# Patient Record
Sex: Male | Born: 1980 | Race: Black or African American | Hispanic: No | Marital: Married | Smoking: Never smoker
Health system: Southern US, Community
[De-identification: ages and names within clinical notes are randomized; demographics above are authoritative.]

---
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
## 2006-12-04 ENCOUNTER — Emergency Department (HOSPITAL_COMMUNITY): Admission: EM | Admit: 2006-12-04 | Discharge: 2006-12-04 | Payer: Self-pay | Admitting: Emergency Medicine

## 2009-09-20 ENCOUNTER — Emergency Department (HOSPITAL_COMMUNITY): Admission: EM | Admit: 2009-09-20 | Discharge: 2009-09-20 | Payer: Self-pay | Admitting: Emergency Medicine

## 2015-12-15 ENCOUNTER — Encounter (HOSPITAL_COMMUNITY): Payer: Self-pay | Admitting: *Deleted

## 2015-12-15 ENCOUNTER — Emergency Department (HOSPITAL_COMMUNITY)
Admission: EM | Admit: 2015-12-15 | Discharge: 2015-12-15 | Disposition: A | Discharge status: Home / Self Care | Payer: Self-pay | Attending: Emergency Medicine | Admitting: Emergency Medicine

## 2015-12-15 DIAGNOSIS — K0889 Other specified disorders of teeth and supporting structures: Secondary | ICD-10-CM | POA: Insufficient documentation

## 2015-12-15 MED ORDER — PENICILLIN V POTASSIUM 500 MG PO TABS: 500.0000 mg | ORAL_TABLET | Freq: Three times a day (TID) | ORAL | 0 refills | Status: AC

## 2015-12-15 MED ORDER — TRAMADOL HCL 50 MG PO TABS: 50.0000 mg | ORAL_TABLET | Freq: Four times a day (QID) | ORAL | 0 refills | Status: AC | PRN

## 2015-12-15 MED ORDER — PENICILLIN V POTASSIUM 250 MG PO TABS
500.0000 mg | ORAL_TABLET | Freq: Once | ORAL | Status: AC
  Administered 2015-12-15: 500 mg via ORAL
  Filled 2015-12-15: qty 2

## 2015-12-15 MED ORDER — TRAMADOL HCL 50 MG PO TABS
50.0000 mg | ORAL_TABLET | Freq: Once | ORAL | Status: AC
  Administered 2015-12-15: 50 mg via ORAL
  Filled 2015-12-15: qty 1

## 2015-12-15 NOTE — ED Provider Notes (Signed)
  [facility]-EMERGENCY DEPT Provider Note   CSN: [id number] Arrival date & time: 12/15/15  0201  First Provider Contact:  None       History   Chief Complaint Chief Complaint  Patient presents with  . Dental Pain    HPI Kevin Costa is a 35 y.o. male.  HPI  History reviewed. No pertinent past medical history.  There are no active problems to display for this patient.   History reviewed. No pertinent surgical history.     Home Medications    Prior to Admission medications   Medication Sig Start Date End Date Taking? Authorizing Provider  penicillin v potassium (VEETID) 500 MG tablet Take 1 tablet (500 mg total) by mouth 3 (three) times daily. 12/15/15   Earley FavorGail Ayshia Gramlich, NP  traMADol (ULTRAM) 50 MG tablet Take 1 tablet (50 mg total) by mouth every 6 (six) hours as needed. 12/15/15   Earley FavorGail Rielle Schlauch, NP    Family History No family history on file.  Social History Social History  Substance Use Topics  . Smoking status: Never Smoker  . Smokeless tobacco: Never Used  . Alcohol use Yes     Allergies   Review of patient's allergies indicates no known allergies.   Review of Systems Review of Systems  HENT: Positive for dental problem.   Neurological: Negative for headaches.  All other systems reviewed and are negative.    Physical Exam Updated Vital Signs BP 136/90 (BP Location: Right Arm)   Pulse (!) 50   Temp 98 F (36.7 C) (Oral)   Resp 16   Ht 6\' 7"  (2.007 m)   Wt 81.6 kg   SpO2 98%   BMI 20.28 kg/m   Physical Exam  Constitutional: He appears well-developed.  HENT:  Head: Normocephalic.  Mouth/Throat:    Neck: Normal range of motion.  Cardiovascular: Normal rate.   Pulmonary/Chest: Effort normal.  Musculoskeletal: Normal range of motion.  Neurological: He is alert.  Skin: Skin is warm and dry.  Psychiatric: He has a normal mood and affect.  Nursing note and vitals reviewed.    ED Treatments / Results  Labs (all labs ordered are listed, but only  abnormal results are displayed) Labs Reviewed - No data to display  EKG  EKG Interpretation None       Radiology No results found.  Procedures Procedures (including critical care time)  Medications Ordered in ED Medications  traMADol (ULTRAM) tablet 50 mg (not administered)  penicillin v potassium (VEETID) tablet 500 mg (not administered)     Initial Impression / Assessment and Plan / ED Course  I have reviewed the triage vital signs and the nursing notes.  Pertinent labs & imaging results that were available during my care of the patient were reviewed by me and considered in my medical decision making (see chart for details).  Clinical Course       Final Clinical Impressions(s) / ED Diagnoses   Final diagnoses:  Pain, dental    New Prescriptions New Prescriptions   PENICILLIN V POTASSIUM (VEETID) 500 MG TABLET    Take 1 tablet (500 mg total) by mouth 3 (three) times daily.   TRAMADOL (ULTRAM) 50 MG TABLET    Take 1 tablet (50 mg total) by mouth every 6 (six) hours as needed.     Earley FavorGail Saqib Cazarez, NP 12/15/15 0301    Layla MawKristen N Ward, DO 12/15/15 [id number]

## 2015-12-15 NOTE — Discharge Instructions (Signed)
You have been give a resource guide to find a dentist  You have been give 2 prescriptions one for pain control the other an antibiotic to control/prevent infection

## 2015-12-15 NOTE — ED Triage Notes (Signed)
Patient states he has a broken back tooth to the top left and a wisdom tooth on the bottom left is causing pain

## 2017-06-24 ENCOUNTER — Emergency Department (HOSPITAL_COMMUNITY)
Admission: EM | Admit: 2017-06-24 | Discharge: 2017-06-24 | Disposition: A | Discharge status: Home / Self Care | Payer: 59 | Attending: Emergency Medicine | Admitting: Emergency Medicine

## 2017-06-24 ENCOUNTER — Encounter (HOSPITAL_COMMUNITY): Payer: Self-pay | Admitting: *Deleted

## 2017-06-24 DIAGNOSIS — K130 Diseases of lips: Secondary | ICD-10-CM | POA: Diagnosis not present

## 2017-06-24 DIAGNOSIS — T7840XA Allergy, unspecified, initial encounter: Secondary | ICD-10-CM | POA: Diagnosis present

## 2017-06-24 DIAGNOSIS — L245 Irritant contact dermatitis due to other chemical products: Secondary | ICD-10-CM | POA: Insufficient documentation

## 2017-06-24 MED ORDER — CEPHALEXIN 500 MG PO CAPS: 500.0000 mg | ORAL_CAPSULE | Freq: Four times a day (QID) | ORAL | 0 refills | Status: AC

## 2017-06-24 MED ORDER — TRIAMCINOLONE ACETONIDE 0.025 % EX OINT: 1.0000 "application " | TOPICAL_OINTMENT | Freq: Two times a day (BID) | CUTANEOUS | 0 refills | Status: AC

## 2017-06-24 NOTE — ED Provider Notes (Signed)
[facility] EMERGENCY DEPARTMENT Provider Note   CSN: [id number] Arrival date & time: 06/24/17  0751     History   Chief Complaint Chief Complaint  Patient presents with  . Allergic Reaction    HPI Kevin Costa is a 37 y.o. male resents to the emergency department with a chief complaint of redness, pain, swelling to the superior and inferior lips with surrounding redness to the perioral skin.   He reports that his lips were extremely dry and cracked 2 days ago so he applied coconut oil and coconut butter to the lips and surrounding area over the last two days. He reports the lips gradually became increasingly red, painful, and swollen, and the perioral skin became red.   He denies throat closing, tongue swelling, shortness of breath, oral lesions chest pain, rash, lightheadedness, dizziness, dental pain, sore throat fever, or chills.  He reports 2 similar episodes in the past, most recent was 2 months ago.  Both episodes began with dry chapped lips and worsen after he applied cocoa butter.  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  He denies new foods, lotions, face wash, or detergents.  He reports that he changed body wash some time ago.  He has no chronic medical conditions.  He takes no daily medications or supplements.   No history of asthma, allergies, or allergic reactions.  The history is provided by the patient. No language interpreter was used.    History reviewed. No pertinent past medical history.  There are no active problems to display for this patient.   History reviewed. No pertinent surgical history.     Home Medications    Prior to Admission medications   Medication Sig Start Date End Date Taking? Authorizing Provider  cephALEXin (KEFLEX) 500 MG capsule Take 1 capsule (500 mg total) by mouth 4 (four) times daily for 5 days. 06/24/17 06/29/17  Kaeden Mester A, PA-C  penicillin v potassium (VEETID) 500 MG tablet  Take 1 tablet (500 mg total) by mouth 3 (three) times daily. 12/15/15   Earley Favor, NP  traMADol (ULTRAM) 50 MG tablet Take 1 tablet (50 mg total) by mouth every 6 (six) hours as needed. 12/15/15   Earley Favor, NP  triamcinolone (KENALOG) 0.025 % ointment Apply 1 application topically 2 (two) times daily. 06/24/17   Pierra Skora A, PA-C    Family History No family history on file.  Social History Social History   Tobacco Use  . Smoking status: Never Smoker  . Smokeless tobacco: Never Used  Substance Use Topics  . Alcohol use: Yes  . Drug use: No     Allergies   Patient has no known allergies.   Review of Systems Review of Systems  Constitutional: Negative for appetite change, chills and fever.  HENT: Positive for facial swelling (lips). Negative for mouth sores, sore throat, trouble swallowing and voice change.   Respiratory: Negative for shortness of breath and stridor.   Cardiovascular: Negative for chest pain.  Gastrointestinal: Negative for abdominal pain, diarrhea, nausea and vomiting.  Genitourinary: Negative for dysuria.  Musculoskeletal: Negative for back pain.  Skin: Positive for color change. Negative for rash.  Allergic/Immunologic: Negative for immunocompromised state.  Neurological: Negative for dizziness, syncope, light-headedness and headaches.  Psychiatric/Behavioral: Negative for confusion.     Physical Exam Updated Vital Signs BP (!) 122/91 (BP Location: Left Arm)   Pulse (!) 52   Temp 98.4 F (36.9 C) (Oral)  Resp 16   SpO2 100%   Physical Exam  Constitutional: He appears well-developed and well-nourished. No distress.  Sitting upright comfortably.  Speaking in complete fluent sentences without increased work of breathing.  NAD.  HENT:  Head: Normocephalic.  Mouth/Throat: Uvula is midline, oropharynx is clear and moist and mucous membranes are normal. No oral lesions. No trismus in the jaw. No dental abscesses or uvula swelling. No  oropharyngeal exudate or posterior oropharyngeal edema. No tonsillar exudate.  Erythema noted to the circumferential perioral region that extends across the vermilion border to the superior and inferior labia.  Bilateral labia are mildly edematous and tender to palpation.  Cracking is present over the bilateral oral measures.  No warmth or focal erythema.  Oropharynx is patent.  Eyes: Conjunctivae are normal.  Neck: Normal range of motion. Neck supple. No tracheal deviation present.  Cardiovascular: Normal rate, regular rhythm, normal heart sounds and intact distal pulses. Exam reveals no gallop and no friction rub.  No murmur heard. Pulmonary/Chest: Effort normal and breath sounds normal. No stridor. No respiratory distress. He has no wheezes. He has no rales. He exhibits no tenderness.  Abdominal: Soft. He exhibits no distension.  Neurological: He is alert.  Skin: Skin is warm and dry. He is not diaphoretic.  Psychiatric: His behavior is normal.  Nursing note and vitals reviewed.    ED Treatments / Results  Labs (all labs ordered are listed, but only abnormal results are displayed) Labs Reviewed - No data to display  EKG  EKG Interpretation None       Radiology No results found.  Procedures Procedures (including critical care time)  Medications Ordered in ED Medications - No data to display   Initial Impression / Assessment and Plan / ED Course  I have reviewed the triage vital signs and the nursing notes.  Pertinent labs & imaging results that were available during my care of the patient were reviewed by me and considered in my medical decision making (see chart for details).     37 year old male with no chronic medical problems who does not take any daily medications presents with erythema to the perioral region, extending to the bilateral lips with mild edema and tenderness noted to the bilateral lips.  He is hemodynamically stable and in no acute distress.  No  constitutional symptoms.  He has been applying cocoa butter and coconut oil solution that contains many other plant extracts to his lips for the last 2 days because they have been dry and chapped prior to the onset of the erythema and edema.  The patient was seen and evaluated by Dr. Erma HeritageIsaacs, attending physician.  His symptoms appear consistent with a chemical irritant contact dermatitis or cheilitis.  I suspect this is secondary to spending a large amount of time outside of his lips become dry and chapped.  Doubt angioedema, syphilis, cellulitis, allergic colitis, or retinoid associated cheilitis.  We will discharge the patient to home with a low potency corticosteroid ointment and a as needed prescription for Keflex if he develops a secondary cellulitis.  Strict return precautions given.  He is in no acute distress.  Patient is safe for discharge home at this time.  Final Clinical Impressions(s) / ED Diagnoses   Final diagnoses:  Irritant contact dermatitis due to other chemical products  Cheilitis    ED Discharge Orders        Ordered    triamcinolone (KENALOG) 0.025 % ointment  2 times daily     06/24/17  0901    cephALEXin (KEFLEX) 500 MG capsule  4 times daily     06/24/17 0901       Frederik Pear A, PA-C 06/24/17 1610    Shaune Pollack, MD 06/24/17 [phone]

## 2017-06-24 NOTE — ED Triage Notes (Signed)
Pt in c/o redness around lips onset x 2 days ago with angioedema onset today, pt denies SOB, unknown allergy, denies pain, A&O x4

## 2017-06-24 NOTE — Discharge Instructions (Signed)
Apply a thin layer of triamcinolone ointment to the skin 2 times daily.  Please stop using this medication when the redness and irritation to the lips and surrounding skin resolves.  Long-term use can cause thinning of the skin and contact with direct sunlight for a long period of time may cause discoloration.   If you develop redness, warmth and swelling that starts in one area of the lips and continues to spread, this might be a skin infection called cellulitis. If this develop, you can fill the prescription for Keflex, which is an antibiotic.  Take 1 tablet of this medication every 6 hours for 5 days.  However, if you do not develop these symptoms, there is no need to fill the Keflex because it will not help the current irritations to your lips.  Avoid applying products to the skin and lips that may contain fragrance or other added ingredients, such as cocoa butter coconut oil.  Vaseline or petroleum jelly is available over the counter and can help treat chapped lips and skin.  The symptoms can be worse in the wintertime so petroleum jelly can be used to prevent the symptoms as well.  If you develop new or worsening symptoms, including fever, chills, shortness of breath, feeling as if your throat is closing or your tongue is closing, or other concerning symptoms, please return to the emergency department for re-evaluation.

## 2018-12-06 ENCOUNTER — Other Ambulatory Visit: Payer: Self-pay

## 2018-12-06 DIAGNOSIS — Z20822 Contact with and (suspected) exposure to covid-19: Secondary | ICD-10-CM

## 2018-12-08 LAB — NOVEL CORONAVIRUS, NAA: SARS-CoV-2, NAA: NOT DETECTED

## 2019-03-20 ENCOUNTER — Other Ambulatory Visit: Payer: Self-pay

## 2019-03-20 DIAGNOSIS — Z20822 Contact with and (suspected) exposure to covid-19: Secondary | ICD-10-CM

## 2019-03-23 LAB — NOVEL CORONAVIRUS, NAA: SARS-CoV-2, NAA: NOT DETECTED

## 2019-05-02 ENCOUNTER — Ambulatory Visit: Payer: HRSA Program | Attending: Internal Medicine

## 2019-05-02 DIAGNOSIS — Z20828 Contact with and (suspected) exposure to other viral communicable diseases: Secondary | ICD-10-CM | POA: Insufficient documentation

## 2019-05-02 DIAGNOSIS — Z20822 Contact with and (suspected) exposure to covid-19: Secondary | ICD-10-CM

## 2019-05-03 LAB — NOVEL CORONAVIRUS, NAA: SARS-CoV-2, NAA: NOT DETECTED

## 2019-10-19 ENCOUNTER — Emergency Department (HOSPITAL_COMMUNITY): Payer: No Typology Code available for payment source

## 2019-10-19 ENCOUNTER — Emergency Department (HOSPITAL_COMMUNITY)
Admission: EM | Admit: 2019-10-19 | Discharge: 2019-10-19 | Disposition: A | Discharge status: Home / Self Care | Payer: No Typology Code available for payment source | Attending: Emergency Medicine | Admitting: Emergency Medicine

## 2019-10-19 ENCOUNTER — Other Ambulatory Visit: Payer: Self-pay

## 2019-10-19 DIAGNOSIS — Z79899 Other long term (current) drug therapy: Secondary | ICD-10-CM | POA: Diagnosis not present

## 2019-10-19 DIAGNOSIS — R202 Paresthesia of skin: Secondary | ICD-10-CM | POA: Diagnosis present

## 2019-10-19 DIAGNOSIS — Z23 Encounter for immunization: Secondary | ICD-10-CM | POA: Diagnosis not present

## 2019-10-19 MED ORDER — TETANUS-DIPHTH-ACELL PERTUSSIS 5-2.5-18.5 LF-MCG/0.5 IM SUSP
0.5000 mL | Freq: Once | INTRAMUSCULAR | Status: AC
  Administered 2019-10-19: 0.5 mL via INTRAMUSCULAR
  Filled 2019-10-19: qty 0.5

## 2019-10-19 MED ORDER — PREDNISONE 20 MG PO TABS
60.0000 mg | ORAL_TABLET | Freq: Once | ORAL | Status: AC
  Administered 2019-10-19: 60 mg via ORAL
  Filled 2019-10-19: qty 3

## 2019-10-19 MED ORDER — PREDNISONE 10 MG (21) PO TBPK: ORAL_TABLET | Freq: Every day | ORAL | 0 refills | Status: AC

## 2019-10-19 MED ORDER — CYCLOBENZAPRINE HCL 10 MG PO TABS
10.0000 mg | ORAL_TABLET | Freq: Once | ORAL | Status: AC
  Administered 2019-10-19: 10 mg via ORAL
  Filled 2019-10-19: qty 1

## 2019-10-19 MED ORDER — CYCLOBENZAPRINE HCL 10 MG PO TABS: 10.0000 mg | ORAL_TABLET | Freq: Two times a day (BID) | ORAL | 0 refills | Status: AC | PRN

## 2019-10-19 NOTE — Discharge Instructions (Addendum)
1. Medications: Take steroid taper as prescribed with food to avoid upset stomach issues.  Do not take ibuprofen, Advil, Aleve, or Motrin while taking this medicine.  You may take [phone] mg of Tylenol every 6 hours as needed for pain. Do not exceed 4000 mg of Tylenol daily.  You can take Flexeril as needed for muscle relaxation but this medication may make you drowsy so do not drive, drink alcohol, operate heavy machinery, or make important decisions while you are using this medicine.  I typically recommend only taking this medicine at night.  You can also cut these tablets in half if they are very strong. 2. Treatment: rest, try to avoid prolonged positioning.  Apply ice or heat whichever feels best 20 minutes at a time a few times daily.  Hot showers and hot baths can be helpful.  Do some gentle stretching throughout the day to avoid stiffness. 3. Follow Up: Please followup with your PCP in 1 week if no improvement for discussion of your diagnoses and further evaluation after today's visit; if you do not have a primary care doctor use the resource guide provided to find one; Please return to the ER for worsening symptoms or other concerns such as worsening swelling, redness of the skin, fevers, loss of pulses, or loss of feeling
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
## 2019-10-19 NOTE — ED Notes (Signed)
Patient verbalizes understanding of discharge instructions. Opportunity for questioning and answers were provided. Armband removed by staff, pt discharged from ED ambulatory.   
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
## 2020-01-09 ENCOUNTER — Other Ambulatory Visit: Payer: Self-pay

## 2020-01-09 DIAGNOSIS — Z20822 Contact with and (suspected) exposure to covid-19: Secondary | ICD-10-CM

## 2020-01-11 LAB — NOVEL CORONAVIRUS, NAA: SARS-CoV-2, NAA: NOT DETECTED
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
## 2022-09-28 ENCOUNTER — Emergency Department (HOSPITAL_BASED_OUTPATIENT_CLINIC_OR_DEPARTMENT_OTHER)
Admission: EM | Admit: 2022-09-28 | Discharge: 2022-09-28 | Disposition: A | Discharge status: Home / Self Care | Payer: Self-pay | Attending: Emergency Medicine | Admitting: Emergency Medicine

## 2022-09-28 ENCOUNTER — Emergency Department (HOSPITAL_BASED_OUTPATIENT_CLINIC_OR_DEPARTMENT_OTHER): Payer: Self-pay

## 2022-09-28 ENCOUNTER — Other Ambulatory Visit: Payer: Self-pay

## 2022-09-28 ENCOUNTER — Encounter (HOSPITAL_BASED_OUTPATIENT_CLINIC_OR_DEPARTMENT_OTHER): Payer: Self-pay | Admitting: Emergency Medicine

## 2022-09-28 DIAGNOSIS — Y9367 Activity, basketball: Secondary | ICD-10-CM | POA: Insufficient documentation

## 2022-09-28 DIAGNOSIS — X501XXA Overexertion from prolonged static or awkward postures, initial encounter: Secondary | ICD-10-CM | POA: Insufficient documentation

## 2022-09-28 DIAGNOSIS — M25562 Pain in left knee: Secondary | ICD-10-CM | POA: Insufficient documentation

## 2022-09-28 MED ORDER — KETOROLAC TROMETHAMINE 60 MG/2ML IM SOLN
30.0000 mg | Freq: Once | INTRAMUSCULAR | Status: AC
  Administered 2022-09-28: 30 mg via INTRAMUSCULAR
  Filled 2022-09-28: qty 2

## 2022-09-28 MED ORDER — MELOXICAM 15 MG PO TABS: 15.0000 mg | ORAL_TABLET | Freq: Every day | ORAL | 0 refills | Status: AC
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
## 2022-09-28 NOTE — ED Notes (Signed)
Dr. Palumbo at bedside. 

## 2022-09-28 NOTE — ED Triage Notes (Signed)
Patient arrived via POV c/o left knee pain x 6 hr pta. Patient states playing basketball, had an person fall into him, hyperextending knee. Patient states 10/10 pain. Patient states pain with extension and flexion. Patient is AO x 4, VS WDL, normal gait.

## 2023-04-27 ENCOUNTER — Emergency Department (HOSPITAL_COMMUNITY): Payer: Self-pay

## 2023-04-27 ENCOUNTER — Encounter (HOSPITAL_COMMUNITY): Payer: Self-pay

## 2023-04-27 ENCOUNTER — Other Ambulatory Visit: Payer: Self-pay

## 2023-04-27 ENCOUNTER — Observation Stay (HOSPITAL_COMMUNITY)
Admission: EM | Admit: 2023-04-27 | Discharge: 2023-04-28 | Disposition: A | Discharge status: Home / Self Care | Payer: Self-pay | Attending: Surgery | Admitting: Surgery

## 2023-04-27 DIAGNOSIS — R109 Unspecified abdominal pain: Secondary | ICD-10-CM | POA: Diagnosis present

## 2023-04-27 DIAGNOSIS — R112 Nausea with vomiting, unspecified: Secondary | ICD-10-CM | POA: Insufficient documentation

## 2023-04-27 DIAGNOSIS — D72829 Elevated white blood cell count, unspecified: Secondary | ICD-10-CM | POA: Insufficient documentation

## 2023-04-27 DIAGNOSIS — R1084 Generalized abdominal pain: Principal | ICD-10-CM | POA: Insufficient documentation

## 2023-04-27 LAB — URINALYSIS, ROUTINE W REFLEX MICROSCOPIC
Bilirubin Urine: NEGATIVE
Glucose, UA: NEGATIVE mg/dL
Hgb urine dipstick: NEGATIVE
Ketones, ur: 5 mg/dL — AB
Leukocytes,Ua: NEGATIVE
Nitrite: NEGATIVE
Protein, ur: NEGATIVE mg/dL
Specific Gravity, Urine: 1.027 (ref 1.005–1.030)
pH: 6 (ref 5.0–8.0)

## 2023-04-27 LAB — CBC
HCT: 41.7 % (ref 39.0–52.0)
Hemoglobin: 13.5 g/dL (ref 13.0–17.0)
MCH: 28.2 pg (ref 26.0–34.0)
MCHC: 32.4 g/dL (ref 30.0–36.0)
MCV: 87.2 fL (ref 80.0–100.0)
Platelets: 162 10*3/uL (ref 150–400)
RBC: 4.78 MIL/uL (ref 4.22–5.81)
RDW: 12.2 % (ref 11.5–15.5)
WBC: 14.6 10*3/uL — ABNORMAL HIGH (ref 4.0–10.5)
nRBC: 0 % (ref 0.0–0.2)

## 2023-04-27 LAB — COMPREHENSIVE METABOLIC PANEL
ALT: 20 U/L (ref 0–44)
AST: 25 U/L (ref 15–41)
Albumin: 4.1 g/dL (ref 3.5–5.0)
Alkaline Phosphatase: 50 U/L (ref 38–126)
Anion gap: 8 (ref 5–15)
BUN: 16 mg/dL (ref 6–20)
CO2: 25 mmol/L (ref 22–32)
Calcium: 9 mg/dL (ref 8.9–10.3)
Chloride: 105 mmol/L (ref 98–111)
Creatinine, Ser: 1.22 mg/dL (ref 0.61–1.24)
GFR, Estimated: >60 mL/min (ref >60)
Glucose, Bld: 142 mg/dL — ABNORMAL HIGH (ref 70–99)
Potassium: 3.8 mmol/L (ref 3.5–5.1)
Sodium: 138 mmol/L (ref 135–145)
Total Bilirubin: 0.7 mg/dL (ref <1.2)
Total Protein: 6.8 g/dL (ref 6.5–8.1)

## 2023-04-27 LAB — HIV ANTIBODY (ROUTINE TESTING W REFLEX): HIV Screen 4th Generation wRfx: NONREACTIVE

## 2023-04-27 LAB — LIPASE, BLOOD: Lipase: 31 U/L (ref 11–51)

## 2023-04-27 MED ORDER — MORPHINE SULFATE (PF) 2 MG/ML IV SOLN
2.0000 mg | Freq: Once | INTRAVENOUS | Status: AC
  Administered 2023-04-27: 2 mg via INTRAVENOUS
  Filled 2023-04-27: qty 1

## 2023-04-27 MED ORDER — ONDANSETRON 4 MG PO TBDP: 4.0000 mg | ORAL_TABLET | Freq: Four times a day (QID) | ORAL | Status: DC | PRN

## 2023-04-27 MED ORDER — SODIUM CHLORIDE (PF) 0.9 % IJ SOLN
INTRAMUSCULAR | Status: AC
Start: 2023-04-27 — End: ?
  Filled 2023-04-27: qty 50

## 2023-04-27 MED ORDER — MORPHINE SULFATE (PF) 2 MG/ML IV SOLN: 2.0000 mg | INTRAVENOUS | Status: DC | PRN

## 2023-04-27 MED ORDER — PROCHLORPERAZINE MALEATE 10 MG PO TABS: 10.0000 mg | ORAL_TABLET | Freq: Four times a day (QID) | ORAL | Status: DC | PRN

## 2023-04-27 MED ORDER — ENOXAPARIN SODIUM 40 MG/0.4ML IJ SOSY: 40.0000 mg | PREFILLED_SYRINGE | INTRAMUSCULAR | Status: DC

## 2023-04-27 MED ORDER — DIPHENHYDRAMINE HCL 25 MG PO CAPS: 25.0000 mg | ORAL_CAPSULE | Freq: Four times a day (QID) | ORAL | Status: DC | PRN

## 2023-04-27 MED ORDER — HYDRALAZINE HCL 20 MG/ML IJ SOLN: 10.0000 mg | INTRAMUSCULAR | Status: DC | PRN

## 2023-04-27 MED ORDER — PROCHLORPERAZINE EDISYLATE 10 MG/2ML IJ SOLN: 5.0000 mg | Freq: Four times a day (QID) | INTRAMUSCULAR | Status: DC | PRN

## 2023-04-27 MED ORDER — PIPERACILLIN-TAZOBACTAM 3.375 G IVPB 30 MIN
3.3750 g | Freq: Once | INTRAVENOUS | Status: AC
  Administered 2023-04-27: 3.375 g via INTRAVENOUS
  Filled 2023-04-27: qty 50

## 2023-04-27 MED ORDER — SODIUM CHLORIDE (PF) 0.9 % IJ SOLN
INTRAMUSCULAR | Status: AC
  Filled 2023-04-27: qty 50

## 2023-04-27 MED ORDER — ONDANSETRON HCL 4 MG/2ML IJ SOLN
4.0000 mg | Freq: Once | INTRAMUSCULAR | Status: AC
  Administered 2023-04-27: 4 mg via INTRAVENOUS
  Filled 2023-04-27: qty 2

## 2023-04-27 MED ORDER — DICYCLOMINE HCL 10 MG/ML IM SOLN
20.0000 mg | Freq: Once | INTRAMUSCULAR | Status: AC
  Administered 2023-04-27: 20 mg via INTRAMUSCULAR
  Filled 2023-04-27: qty 2

## 2023-04-27 MED ORDER — DIPHENHYDRAMINE HCL 50 MG/ML IJ SOLN: 25.0000 mg | Freq: Four times a day (QID) | INTRAMUSCULAR | Status: DC | PRN

## 2023-04-27 MED ORDER — ONDANSETRON HCL 4 MG/2ML IJ SOLN
4.0000 mg | Freq: Four times a day (QID) | INTRAMUSCULAR | Status: DC | PRN
Start: 2023-04-27 — End: 2023-04-28

## 2023-04-27 MED ORDER — DEXTROSE-SODIUM CHLORIDE 5-0.45 % IV SOLN: INTRAVENOUS | Status: DC

## 2023-04-27 MED ORDER — IOHEXOL 300 MG/ML  SOLN
100.0000 mL | Freq: Once | INTRAMUSCULAR | Status: AC | PRN
  Administered 2023-04-27: 100 mL via INTRAVENOUS

## 2023-04-27 MED ORDER — IOHEXOL 300 MG/ML  SOLN
30.0000 mL | Freq: Once | INTRAMUSCULAR | Status: AC | PRN
  Administered 2023-04-27: 30 mL via ORAL

## 2023-04-27 MED ORDER — ACETAMINOPHEN 325 MG PO TABS: 650.0000 mg | ORAL_TABLET | Freq: Four times a day (QID) | ORAL | Status: DC | PRN

## 2023-04-27 MED ORDER — METOCLOPRAMIDE HCL 5 MG/ML IJ SOLN
10.0000 mg | Freq: Once | INTRAMUSCULAR | Status: AC
  Administered 2023-04-27: 10 mg via INTRAVENOUS
  Filled 2023-04-27: qty 2

## 2023-04-27 NOTE — ED Provider Notes (Signed)
Handoff from L. Edyth Gunnels PA, pending surgical consultation.  Procedures  Procedures  ED Course / MDM    Medical Decision Making Patient is a 42 year old male, here with generalized abdominal tenderness, pending surgical consult, I spoke with PA, Olene Floss, she states that she thinks this is unlikely be appendicitis, but will admit for observation and further hydration.  Admitted to general surgery team.  They will manage further wise.  Given 1 dose of Zosyn, given extended wait time, and concern for appendicitis initially prior to surgical evaluation.  Amount and/or Complexity of Data Reviewed Labs: ordered. Radiology: ordered.  Risk Prescription drug management.         Pete Pelt, PA 04/27/23 1610    Coral Spikes, [place] 04/27/23 2335

## 2023-04-27 NOTE — H&P (Signed)
History and Physical Exam Note  Kevin Costa 1980/06/19  [id number].    Requesting MD: Dr. Estell Harpin Chief Complaint/Reason for Consult: abdominal pain, possible appendicitis  HPI:  42 y.o. male without significant medical history who presented to [facility] ED this morning with abdominal pain, nausea, and vomiting. Pain began last night around 7:30 PM. The pain is described as generalized with cramping exacerbations and inability to stand up straight. He denies diarrhea or constipation, last BM was yesterday morning and was loose and non-bloody. He reports muliple episodes of emesis last night and this morning, he threw up the spaghetti he ate yesterday. He denies fever, chills, dysuria, flank pain. He has had multiple sick contacts with GI symptoms from work.   He has leukocytosis of 14.6 and CT scan showing free pelvic fluid. Contrast did not reach terminal ileum or colon and therefore imaging was limited and appendix not clearly visualized. General surgery asked to see regarding possible appendicitis.   Substance use:  marijuana use, denies cigarette use  Allergies: nkda Blood thinners: none Past Surgeries: none  There is a friend at the bedside, she works with him.   ROS: Reviewed and as above  History reviewed. No pertinent family history.  History reviewed. No pertinent past medical history.  History reviewed. No pertinent surgical history.  Social History:  reports that he has never smoked. He has never used smokeless tobacco. He reports current alcohol use. He reports current drug use. Drug: Marijuana.  Allergies: No Known Allergies  (Not in a hospital admission)   Blood pressure (!) 158/96, pulse (!) 57, temperature 98.5 F (36.9 C), temperature source Oral, resp. rate 18, height 6\' 7"  (2.007 m), weight 87.5 kg, SpO2 100%. Physical Exam: General: pleasant, WD, male who is laying in bed in NAD HEENT: head is normocephalic, atraumatic.  Sclera are noninjected.  Pupils  equal and round. EOMs intact.  Ears and nose without any masses or lesions.  Mouth is pink and moist Heart: regular, rate, and rhythm.  Normal s1,s2. No obvious murmurs, gallops, or rubs noted.  Palpable radial and pedal pulses bilaterally Lungs: CTAB, no wheezes, rhonchi, or rales noted.  Respiratory effort nonlabored Abd: soft, nondistended, mild RLQ tenderness to deep palpation without guarding, +BS, no masses, hernias, or organomegaly MSK: all 4 extremities are symmetrical with no cyanosis, clubbing, or edema. Skin: warm and dry with no masses, lesions, or rashes Neuro: Cranial nerves 2-12 grossly intact, sensation is normal throughout Psych: A&Ox3 with an appropriate affect.    Results for orders placed or performed during the hospital encounter of 04/27/23 (from the past 48 hours)  Lipase, blood     Status: None   Collection Time: 04/27/23 12:34 AM  Result Value Ref Range   Lipase 31 11 - 51 U/L    Comment: Performed at [facility], [address], [place], [place] [address]  Comprehensive metabolic panel     Status: Abnormal   Collection Time: 04/27/23 12:34 AM  Result Value Ref Range   Sodium 138 135 - 145 mmol/L   Potassium 3.8 3.5 - 5.1 mmol/L   Chloride 105 98 - 111 mmol/L   CO2 25 22 - 32 mmol/L   Glucose, Bld 142 (H) 70 - 99 mg/dL    Comment: Glucose reference range applies only to samples taken after fasting for at least 8 hours.   BUN 16 6 - 20 mg/dL   Creatinine, Ser 8.41 0.61 - 1.24 mg/dL  Calcium 9.0 8.9 - 10.3 mg/dL   Total Protein 6.8 6.5 - 8.1 g/dL   Albumin 4.1 3.5 - 5.0 g/dL   AST 25 15 - 41 U/L   ALT 20 0 - 44 U/L   Alkaline Phosphatase 50 38 - 126 U/L   Total Bilirubin 0.7 <1.2 mg/dL   GFR, Estimated >47 >82 mL/min    Comment: (NOTE) Calculated using the CKD-EPI Creatinine Equation (2021)    Anion gap 8 5 - 15    Comment: Performed at [facility], [address], [place], [place] [address]  CBC     Status:  Abnormal   Collection Time: 04/27/23 12:34 AM  Result Value Ref Range   WBC 14.6 (H) 4.0 - 10.5 K/uL   RBC 4.78 4.22 - 5.81 MIL/uL   Hemoglobin 13.5 13.0 - 17.0 g/dL   HCT 30.8 65.7 - 84.6 %   MCV 87.2 80.0 - 100.0 fL   MCH 28.2 26.0 - 34.0 pg   MCHC 32.4 30.0 - 36.0 g/dL   RDW 96.2 95.2 - 84.1 %   Platelets 162 150 - 400 K/uL   nRBC 0.0 0.0 - 0.2 %    Comment: Performed at [facility], [address], [place], [place] [address]  Urinalysis, Routine w reflex microscopic -Urine, Clean Catch     Status: Abnormal   Collection Time: 04/27/23 12:39 AM  Result Value Ref Range   Color, Urine YELLOW YELLOW   APPearance CLEAR CLEAR   Specific Gravity, Urine 1.027 1.005 - 1.030   pH 6.0 5.0 - 8.0   Glucose, UA NEGATIVE NEGATIVE mg/dL   Hgb urine dipstick NEGATIVE NEGATIVE   Bilirubin Urine NEGATIVE NEGATIVE   Ketones, ur 5 (A) NEGATIVE mg/dL   Protein, ur NEGATIVE NEGATIVE mg/dL   Nitrite NEGATIVE NEGATIVE   Leukocytes,Ua NEGATIVE NEGATIVE    Comment: Performed at [facility], [address] [place], [place] [address]   CT ABDOMEN PELVIS W CONTRAST Result Date: 04/27/2023 CLINICAL DATA:  Abdominal pain, acute, nonlocalized. Multiple episodes of vomiting. EXAM: CT ABDOMEN AND PELVIS WITH CONTRAST TECHNIQUE: Multidetector CT imaging of the abdomen and pelvis was performed using the standard protocol following bolus administration of intravenous contrast. RADIATION DOSE REDUCTION: This exam was performed according to the departmental dose-optimization program which includes automated exposure control, adjustment of the mA and/or kV according to patient size and/or use of iterative reconstruction technique. CONTRAST:  OMNIPAQUE IOHEXOL 300 MG/ML  SOLN COMPARISON:  Abdominopelvic CT 5 hours earlier the same date. No other relevant comparison studies. FINDINGS: Lower chest: Clear lung bases. No significant pleural or pericardial effusion. Hepatobiliary:  The liver is normal in density without suspicious focal abnormality. The liver is imaged prior to opacification of the hepatic veins. New intermediate density within the gallbladder lumen likely due to vicarious excretion of previously administered intravenous contrast. No evidence of gallstones, gallbladder wall thickening or biliary dilatation. Pancreas: Unremarkable. No pancreatic ductal dilatation or surrounding inflammatory changes. Spleen: Normal in size without focal abnormality. Adrenals/Urinary Tract: Both adrenal glands appear normal. No significant abnormality of the kidneys identified. There is contrast material within the renal collecting systems, ureter and bladder from the earlier CT. No bladder abnormalities are seen. Stomach/Bowel: A small amount of enteric contrast was administered and has passed into the distal small bowel. Although the enteric contrast mildly improves bowel assessment, evaluation is still limited by incomplete bowel opacification and limited visceral fat. The stomach and proximal small bowel appear  unremarkable. There is no evidence of bowel obstruction. The terminal ileum is not yet opacified with contrast. The appendix is not clearly visualized. No colonic abnormalities are identified. Vascular/Lymphatic: There are no enlarged abdominal or pelvic lymph nodes. No significant vascular findings. Reproductive: The prostate gland and seminal vesicles appear unremarkable. Other: There is a small amount of free pelvic fluid. No extravasated enteric contrast or pneumoperitoneum. No definite extraluminal fluid collections are seen. Musculoskeletal: No acute or significant osseous findings. IMPRESSION: 1. Small amount of free pelvic fluid, nonspecific. 2. No extravasated enteric contrast or pneumoperitoneum. No evidence of bowel obstruction or perforation. 3. The appendix is still not clearly visualized due to incomplete distal small bowel opacification and limited visceral fat. Surgical  consultation recommended if there is clinical concern for acute appendicitis in this patient with mild leukocytosis. Electronically Signed   By: Carey Bullocks M.D.   On: 04/27/2023 12:53   CT ABDOMEN PELVIS W CONTRAST Result Date: 04/27/2023 CLINICAL DATA:  Abdominal pain, acute. Cramping which began after eating spaghetti this evening. EXAM: CT ABDOMEN AND PELVIS WITH CONTRAST TECHNIQUE: Multidetector CT imaging of the abdomen and pelvis was performed using the standard protocol following bolus administration of intravenous contrast. RADIATION DOSE REDUCTION: This exam was performed according to the departmental dose-optimization program which includes automated exposure control, adjustment of the mA and/or kV according to patient size and/or use of iterative reconstruction technique. CONTRAST:  OMNIPAQUE IOHEXOL 300 MG/ML  SOLN COMPARISON:  None Available. FINDINGS: Lower chest: No acute abnormality. Hepatobiliary: No focal liver abnormality is seen. No gallstones, gallbladder wall thickening, or biliary dilatation. Pancreas: Unremarkable. No pancreatic ductal dilatation or surrounding inflammatory changes. Spleen: Normal in size without focal abnormality. Adrenals/Urinary Tract: Adrenal glands are unremarkable. Kidneys are normal, without renal calculi, focal lesion, or hydronephrosis. Bladder is unremarkable. Stomach/Bowel: Evaluation of bowel pathology is limited due to lack of enteric contrast material and diminished visceral fat within the abdomen and pelvis. The stomach appears normal. The proximal and mid small bowel loops are nondilated. Within the lower abdomen there are several loops of dilated small bowel which measure up to 3.1 cm with decompressed bowel seen proximal and distal to the dilated bowel loops. Abrupt transition to decreased caliber small bowel is noted at the proximal and distal end of the contiguous dilated small bowel loops, image 45 of series 7 and image 42 of series 7. The  appendix is not confidently identified, which reflects limiting factors described above. Normal caliber of the colon up to the level of the rectum. No bowel wall thickening identified. Vascular/Lymphatic: Normal appearance of the abdominal aorta. The upper abdominal vascularity appears patent. No sign abdominopelvic adenopathy. Reproductive: Prostate is unremarkable. Other: No significant free fluid. Within the posterior pelvis there is a fluid density structure anterior to the rectum measuring 4.2 x 2.1 cm, image 69/7 which although likely represents a loop of small bowel this would be difficult to distinguish from underlying fluid collection due to lack of enteric contrast material. No signs of pneumoperitoneum. Musculoskeletal: No acute or significant osseous findings. IMPRESSION: 1. Evaluation of bowel pathology is limited due to lack of enteric contrast material and diminished visceral fat within the abdomen and pelvis. 2. Within the lower abdomen there are several loops of fecalized, dilated small bowel which measure up to 3.1 cm with decompressed bowel seen proximal and distal to the dilated bowel loops. Abrupt transition to decreased caliber small bowel is noted at the proximal and distal end of the contiguous dilated small  bowel loops. Differential considerations include close loop obstruction versus focal ileus. Repeat imaging with IV and enteric contrast material may be helpful for further delineation. 3. Nonvisualization of the appendix. If there is a clinical concern for acute appendicitis then repeat imaging following enteric contrast material would be recommended. 4. Within the posterior pelvis there is a fluid density structure anterior to the rectum measuring 4.2 x 2.1 cm which although likely represents a loop of small bowel this would be difficult to distinguish from underlying abscess due to lack of enteric contrast material. Electronically Signed   By: Signa Kell M.D.   On: 04/27/2023 06:48       Assessment/Plan Abdominal pain Possible appendicitis vs gastroenteritis   Patient seen and examined and relevant labs and imaging reviewed. Unfortunately CT limited due to lack of bowel opacification and limited visceral fat.  He is tender in RLQ on exam and history is somewhat consistent with appendicitis. HE does have an elevated WBC 14. What that being said if he had appendicitis then, even with limited visceral fat, you should be able to appreciate his apepndix on CT. Additionally his symptoms overlap with infectious GI illness of which he has had know sick contacts  Will admit for observation.  FEN: CLD, NPO after MN ID: none  VTE: okay for chemical prophylaxis from surgical standpoint  Dispo: Admit for observation  I reviewed nursing notes, ED provider notes, last 24 h vitals and pain scores, last 48 h intake and output, last 24 h labs and trends, and last 24 h imaging results.   Hosie Spangle, [facility] 04/27/2023, 3:37 PM Please see Amion for pager number during day hours 7:00am-4:30pm

## 2023-04-27 NOTE — ED Provider Notes (Signed)
Received patient at signout from previous provider (see his note) pending CT with oral contrast.  In short, patient presents emergency department for evaluation of generalized abdominal pain, nausea, vomiting that started yesterday.  ED workup is significant for leukocytosis (14.6).  Was provided Bentyl but has not improved.  CT without contrast inconclusive and needs enteric contrast. Following attempting oral ingestion of contrast, patient threw it up prior to completion of full contrast amount. Therefore, CT was not able to be obtained. We provided additional zofran and reglan and CT abdomen with enteric contrast was obtained. However, despite this, the appendix was unable to be visualized. Consulted general surgery as he has a leukocytosis of 14.6 with continued TTP despite multiple analgesia and medications.  Eugenia Mcalpine (General Surgery RN) relayed information to surgical team and they will consult on him following their surgeries. Sign out to oncoming provider pending general surgery recommendations.  Dr. Estell Harpin individually assessed patient and agrees with plan.    Judithann Sheen, PA 04/27/23 [id number]    Bethann Berkshire, MD 04/27/23 [phone]

## 2023-04-27 NOTE — ED Triage Notes (Signed)
Pt states that he had sudden onset of generalized abdominal cramping that began after he ate spaghetti tonight.

## 2023-04-27 NOTE — ED Notes (Signed)
Patient transported to CT 

## 2023-04-27 NOTE — ED Provider Notes (Signed)
[place] EMERGENCY DEPARTMENT AT [facility] Provider Note   CSN: [id number] Arrival date & time: 04/27/23  0009     History  Chief Complaint  Patient presents with   Abdominal Pain    Kevin Costa is a 42 y.o. male with no documented medical history.  Patient presents to ED for evaluation of abdominal pain, nausea and vomiting.  Reports that his abdominal pain began around 727 last night.  States that before this he had eaten some chili from [facility] as well as spaghetti.  Patient believes that he might of had something "bad" and it, food poisoning.  He reports that his symptoms began about 30 minutes after ingestion.  Reports he has had nausea, vomiting as well as abdominal cramping.  Denies any diarrhea, fevers, dysuria, flank pain, body aches or chills.  Denies sore throat, cough.  Denies any alcohol.  Has not taken anything for his symptoms at home.  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   Abdominal Pain Associated symptoms: nausea and vomiting   Associated symptoms: no diarrhea and no fever        Home Medications Prior to Admission medications   Medication Sig Start Date End Date Taking? Authorizing Provider  cyclobenzaprine (FLEXERIL) 10 MG tablet Take 1 tablet (10 mg total) by mouth 2 (two) times daily as needed for muscle spasms. 10/19/19   Fawze, Mina A, PA-C  ibuprofen (ADVIL) 800 MG tablet Take 800 mg by mouth every 8 (eight) hours as needed for mild pain.    [provider]  meloxicam (MOBIC) 15 MG tablet Take 1 tablet (15 mg total) by mouth daily. 09/28/22   Palumbo, April, MD  penicillin v potassium (VEETID) 500 MG tablet Take 1 tablet (500 mg total) by mouth 3 (three) times daily. Patient not taking: Reported on 10/19/2019 12/15/15   Earley Favor, NP  predniSONE (STERAPRED UNI-PAK 21 TAB) 10 MG (21) TBPK tablet Take by mouth daily. Take 6 tabs by mouth daily  for 2 days, then  5 tabs for 2 days, then 4 tabs for 2 days, then 3 tabs for 2 days, 2 tabs for 2 days, then 1 tab by mouth daily for 2 days 10/19/19   Michela Pitcher A, PA-C  PRESCRIPTION MEDICATION Take 1 tablet by mouth once. Muscle relaxant    [provider]  traMADol (ULTRAM) 50 MG tablet Take 1 tablet (50 mg total) by mouth every 6 (six) hours as needed. Patient not taking: Reported on 10/19/2019 12/15/15   Earley Favor, NP  triamcinolone (KENALOG) 0.025 % ointment Apply 1 application topically 2 (two) times daily. Patient not taking: Reported on 10/19/2019 06/24/17   Frederik Pear A, PA-C      Allergies    Patient has no known allergies.    Review of Systems   Review of Systems  Constitutional:  Negative for fever.  Gastrointestinal:  Positive for abdominal pain, nausea and vomiting. Negative for diarrhea.  All other systems reviewed and are negative.   Physical Exam Updated Vital Signs BP 121/70 (BP Location: Left Arm)   Pulse 60   Temp 98.2 F (36.8 C) (Oral)   Resp 16   Ht 6\' 7"  (2.007 m)   Wt 87.5 kg   SpO2 96%   BMI 21.74 kg/m  Physical Exam Vitals and nursing note reviewed.  Constitutional:      General: He is not in acute distress.  Appearance: Normal appearance. He is not ill-appearing, toxic-appearing or diaphoretic.  HENT:     Head: Normocephalic and atraumatic.     Nose: Nose normal.     Mouth/Throat:     Mouth: Mucous membranes are moist.     Pharynx: Oropharynx is clear.  Eyes:     Extraocular Movements: Extraocular movements intact.     Conjunctiva/sclera: Conjunctivae normal.     Pupils: Pupils are equal, round, and reactive to light.  Cardiovascular:     Rate and Rhythm: Normal rate and regular rhythm.  Pulmonary:     Effort: Pulmonary effort is normal.     Breath sounds: Normal breath sounds. No wheezing.  Abdominal:     General: Abdomen is flat. Bowel sounds are normal.     Palpations: Abdomen is soft.     Tenderness: There is abdominal tenderness.      Comments: Nonlocalizing tenderness to abdomen  Musculoskeletal:     Cervical back: Normal range of motion and neck supple. No tenderness.  Skin:    General: Skin is warm and dry.     Capillary Refill: Capillary refill takes less than 2 seconds.  Neurological:     Mental Status: He is alert and oriented to person, place, and time.     ED Results / Procedures / Treatments   Labs (all labs ordered are listed, but only abnormal results are displayed) Labs Reviewed  COMPREHENSIVE METABOLIC PANEL - Abnormal; Notable for the following components:      Result Value   Glucose, Bld 142 (*)    All other components within normal limits  CBC - Abnormal; Notable for the following components:   WBC 14.6 (*)    All other components within normal limits  URINALYSIS, ROUTINE W REFLEX MICROSCOPIC - Abnormal; Notable for the following components:   Ketones, ur 5 (*)    All other components within normal limits  LIPASE, BLOOD    EKG None  Radiology CT ABDOMEN PELVIS W CONTRAST Result Date: 04/27/2023 CLINICAL DATA:  Abdominal pain, acute. Cramping which began after eating spaghetti this evening. EXAM: CT ABDOMEN AND PELVIS WITH CONTRAST TECHNIQUE: Multidetector CT imaging of the abdomen and pelvis was performed using the standard protocol following bolus administration of intravenous contrast. RADIATION DOSE REDUCTION: This exam was performed according to the departmental dose-optimization program which includes automated exposure control, adjustment of the mA and/or kV according to patient size and/or use of iterative reconstruction technique. CONTRAST:  OMNIPAQUE IOHEXOL 300 MG/ML  SOLN COMPARISON:  None Available. FINDINGS: Lower chest: No acute abnormality. Hepatobiliary: No focal liver abnormality is seen. No gallstones, gallbladder wall thickening, or biliary dilatation. Pancreas: Unremarkable. No pancreatic ductal dilatation or surrounding inflammatory changes. Spleen: Normal in size  without focal abnormality. Adrenals/Urinary Tract: Adrenal glands are unremarkable. Kidneys are normal, without renal calculi, focal lesion, or hydronephrosis. Bladder is unremarkable. Stomach/Bowel: Evaluation of bowel pathology is limited due to lack of enteric contrast material and diminished visceral fat within the abdomen and pelvis. The stomach appears normal. The proximal and mid small bowel loops are nondilated. Within the lower abdomen there are several loops of dilated small bowel which measure up to 3.1 cm with decompressed bowel seen proximal and distal to the dilated bowel loops. Abrupt transition to decreased caliber small bowel is noted at the proximal and distal end of the contiguous dilated small bowel loops, image 45 of series 7 and image 42 of series 7. The appendix is not confidently identified, which reflects limiting factors  described above. Normal caliber of the colon up to the level of the rectum. No bowel wall thickening identified. Vascular/Lymphatic: Normal appearance of the abdominal aorta. The upper abdominal vascularity appears patent. No sign abdominopelvic adenopathy. Reproductive: Prostate is unremarkable. Other: No significant free fluid. Within the posterior pelvis there is a fluid density structure anterior to the rectum measuring 4.2 x 2.1 cm, image 69/7 which although likely represents a loop of small bowel this would be difficult to distinguish from underlying fluid collection due to lack of enteric contrast material. No signs of pneumoperitoneum. Musculoskeletal: No acute or significant osseous findings. IMPRESSION: 1. Evaluation of bowel pathology is limited due to lack of enteric contrast material and diminished visceral fat within the abdomen and pelvis. 2. Within the lower abdomen there are several loops of fecalized, dilated small bowel which measure up to 3.1 cm with decompressed bowel seen proximal and distal to the dilated bowel loops. Abrupt transition to decreased  caliber small bowel is noted at the proximal and distal end of the contiguous dilated small bowel loops. Differential considerations include close loop obstruction versus focal ileus. Repeat imaging with IV and enteric contrast material may be helpful for further delineation. 3. Nonvisualization of the appendix. If there is a clinical concern for acute appendicitis then repeat imaging following enteric contrast material would be recommended. 4. Within the posterior pelvis there is a fluid density structure anterior to the rectum measuring 4.2 x 2.1 cm which although likely represents a loop of small bowel this would be difficult to distinguish from underlying abscess due to lack of enteric contrast material. Electronically Signed   By: Signa Kell M.D.   On: 04/27/2023 06:48    Procedures Procedures   Medications Ordered in ED Medications  ondansetron (ZOFRAN) injection 4 mg (4 mg Intravenous Given 04/27/23 0434)  dicyclomine (BENTYL) injection 20 mg (20 mg Intramuscular Given 04/27/23 0434)  iohexol (OMNIPAQUE) 300 MG/ML solution 100 mL (100 mLs Intravenous Contrast Given 04/27/23 0539)    ED Course/ Medical Decision Making/ A&P  Medical Decision Making Amount and/or Complexity of Data Reviewed Labs: ordered. Radiology: ordered.  Risk Prescription drug management.   42 year old male presents for evaluation of abdominal pain.  Please see HPI for further details.  On examination patient is afebrile and nontachycardic.  His lung sounds are clear bilaterally, he is not hypoxic.  His abdomen is soft and compressible throughout with nonlocalizing tenderness.  Neurological examination is at baseline.  Will collect labs and assess.  Will provide patient Zofran and Bentyl.  Patient has leukocytosis of 14, no anemia.  Metabolic panel unremarkable.  Urinalysis shows ketones.  Lipase WNL.  Patient provided Bentyl and Zofran.  On reassessment, states his nausea has decreased but pain persist.   Will proceed with CT scan of abdomen pelvis.  CT abdomen pelvis is very inconclusive.  Patient most likely in need of oral contrast due to body habitus.  Will sign to oncoming provider pending repeat CT scan with oral contrast.  Final Clinical Impression(s) / ED Diagnoses Final diagnoses:  Generalized abdominal pain    Rx / DC Orders ED Discharge Orders     None         Clent Ridges 04/27/23 [id number]    Tilden Fossa, MD 04/27/23 [phone]

## 2023-04-28 LAB — CBC
HCT: 40.8 % (ref 39.0–52.0)
Hemoglobin: 13.6 g/dL (ref 13.0–17.0)
MCH: 28.2 pg (ref 26.0–34.0)
MCHC: 33.3 g/dL (ref 30.0–36.0)
MCV: 84.5 fL (ref 80.0–100.0)
Platelets: 181 10*3/uL (ref 150–400)
RBC: 4.83 MIL/uL (ref 4.22–5.81)
RDW: 12.4 % (ref 11.5–15.5)
WBC: 11.1 10*3/uL — ABNORMAL HIGH (ref 4.0–10.5)
nRBC: 0 % (ref 0.0–0.2)

## 2023-04-28 LAB — BASIC METABOLIC PANEL
Anion gap: 8 (ref 5–15)
BUN: 12 mg/dL (ref 6–20)
CO2: 27 mmol/L (ref 22–32)
Calcium: 8.9 mg/dL (ref 8.9–10.3)
Chloride: 101 mmol/L (ref 98–111)
Creatinine, Ser: 1.14 mg/dL (ref 0.61–1.24)
GFR, Estimated: >60 mL/min (ref >60)
Glucose, Bld: 107 mg/dL — ABNORMAL HIGH (ref 70–99)
Potassium: 3.5 mmol/L (ref 3.5–5.1)
Sodium: 136 mmol/L (ref 135–145)

## 2023-04-28 MED ORDER — ACETAMINOPHEN 325 MG PO TABS: 650.0000 mg | ORAL_TABLET | Freq: Four times a day (QID) | ORAL | Status: AC | PRN

## 2023-04-28 NOTE — ED Notes (Signed)
ED TO INPATIENT HANDOFF REPORT  ED Nurse Name and Phone #: Huntley Dec [phone]  S Name/Age/Gender Kevin Costa 42 y.o. male Room/Bed: WA07/WA07  Code Status   Code Status: Full Code  Home/SNF/Other Patient oriented to: self, place, time, and situation Is this baseline? Yes   Triage Complete: Triage complete  Chief Complaint Abdominal pain [R10.9]  Triage Note Pt states that he had sudden onset of generalized abdominal cramping that began after he ate spaghetti tonight.    Allergies No Known Allergies  Level of Care/Admitting Diagnosis ED Disposition     ED Disposition  Admit   Condition  --   Comment  Hospital Area: [facility] [[id number]]  Level of Care: Med-Surg [16]  May place patient in observation at [facility] or [facility] if equivalent level of care is available:: No  Covid Evaluation: Asymptomatic - no recent exposure (last 10 days) testing not required  Diagnosis: Abdominal pain [846962]  Admitting Physician: CCS, MD [[id number]]  Attending Physician: CCS, MD [[id number]]          B Medical/Surgery History History reviewed. No pertinent past medical history. History reviewed. No pertinent surgical history.   A IV Location/Drains/Wounds Patient Lines/Drains/Airways Status     Active Line/Drains/Airways     Name Placement date Placement time Site Days   Peripheral IV 04/27/23 20 G Anterior;Right Forearm 04/27/23  0434  Forearm  1            Intake/Output Last 24 hours  Intake/Output Summary (Last 24 hours) at 04/28/2023 9528 Last data filed at 04/28/2023 0500 Gross per 24 hour  Intake 1003.58 ml  Output --  Net 1003.58 ml    Labs/Imaging Results for orders placed or performed during the hospital encounter of 04/27/23 (from the past 48 hours)  Lipase, blood     Status: None   Collection Time: 04/27/23 12:34 AM  Result Value Ref Range   Lipase 31 11 - 51 U/L    Comment: Performed at [facility], [address], [place], [place] [address]  Comprehensive metabolic panel     Status: Abnormal   Collection Time: 04/27/23 12:34 AM  Result Value Ref Range   Sodium 138 135 - 145 mmol/L   Potassium 3.8 3.5 - 5.1 mmol/L   Chloride 105 98 - 111 mmol/L   CO2 25 22 - 32 mmol/L   Glucose, Bld 142 (H) 70 - 99 mg/dL    Comment: Glucose reference range applies only to samples taken after fasting for at least 8 hours.   BUN 16 6 - 20 mg/dL   Creatinine, Ser 4.01 0.61 - 1.24 mg/dL   Calcium 9.0 8.9 - 02.7 mg/dL   Total Protein 6.8 6.5 - 8.1 g/dL   Albumin 4.1 3.5 - 5.0 g/dL   AST 25 15 - 41 U/L   ALT 20 0 - 44 U/L   Alkaline Phosphatase 50 38 - 126 U/L   Total Bilirubin 0.7 <1.2 mg/dL   GFR, Estimated >25 >36 mL/min    Comment: (NOTE) Calculated using the CKD-EPI Creatinine Equation (2021)    Anion gap 8 5 - 15    Comment: Performed at [facility], [address] [place], [place] [address]  CBC     Status: Abnormal   Collection Time: 04/27/23 12:34 AM  Result Value Ref Range   WBC 14.6 (H) 4.0 - 10.5 K/uL   RBC 4.78 4.22 - 5.81 MIL/uL   Hemoglobin 13.5 13.0 -  17.0 g/dL   HCT 19.1 47.8 - 29.5 %   MCV 87.2 80.0 - 100.0 fL   MCH 28.2 26.0 - 34.0 pg   MCHC 32.4 30.0 - 36.0 g/dL   RDW 62.1 30.8 - 65.7 %   Platelets 162 150 - 400 K/uL   nRBC 0.0 0.0 - 0.2 %    Comment: Performed at [facility], [address] [place], [place] [address]  Urinalysis, Routine w reflex microscopic -Urine, Clean Catch     Status: Abnormal   Collection Time: 04/27/23 12:39 AM  Result Value Ref Range   Color, Urine YELLOW YELLOW   APPearance CLEAR CLEAR   Specific Gravity, Urine 1.027 1.005 - 1.030   pH 6.0 5.0 - 8.0   Glucose, UA NEGATIVE NEGATIVE mg/dL   Hgb urine dipstick NEGATIVE NEGATIVE   Bilirubin Urine NEGATIVE NEGATIVE   Ketones, ur 5 (A) NEGATIVE mg/dL   Protein, ur NEGATIVE NEGATIVE mg/dL   Nitrite NEGATIVE NEGATIVE   Leukocytes,Ua NEGATIVE NEGATIVE     Comment: Performed at [facility], [address] [place], [place] [address]  HIV Antibody (routine testing w rflx)     Status: None   Collection Time: 04/27/23  6:09 PM  Result Value Ref Range   HIV Screen 4th Generation wRfx Non Reactive Non Reactive    Comment: Performed at [facility], [address] [place], [place] [address]  CBC     Status: Abnormal   Collection Time: 04/28/23  5:00 AM  Result Value Ref Range   WBC 11.1 (H) 4.0 - 10.5 K/uL   RBC 4.83 4.22 - 5.81 MIL/uL   Hemoglobin 13.6 13.0 - 17.0 g/dL   HCT 40.1 02.7 - 25.3 %   MCV 84.5 80.0 - 100.0 fL   MCH 28.2 26.0 - 34.0 pg   MCHC 33.3 30.0 - 36.0 g/dL   RDW 66.4 40.3 - 47.4 %   Platelets 181 150 - 400 K/uL   nRBC 0.0 0.0 - 0.2 %    Comment: Performed at [facility], [address] [place], [place] [address]  Basic metabolic panel     Status: Abnormal   Collection Time: 04/28/23  5:00 AM  Result Value Ref Range   Sodium 136 135 - 145 mmol/L   Potassium 3.5 3.5 - 5.1 mmol/L   Chloride 101 98 - 111 mmol/L   CO2 27 22 - 32 mmol/L   Glucose, Bld 107 (H) 70 - 99 mg/dL    Comment: Glucose reference range applies only to samples taken after fasting for at least 8 hours.   BUN 12 6 - 20 mg/dL   Creatinine, Ser 3.87 0.61 - 1.24 mg/dL   Calcium 8.9 8.9 - 56.4 mg/dL   GFR, Estimated >33 >29 mL/min    Comment: (NOTE) Calculated using the CKD-EPI Creatinine Equation (2021)    Anion gap 8 5 - 15    Comment: Performed at [facility], [address], [place], [place] [address]   CT ABDOMEN PELVIS W CONTRAST Result Date: 04/27/2023 CLINICAL DATA:  Abdominal pain, acute, nonlocalized. Multiple episodes of vomiting. EXAM: CT ABDOMEN AND PELVIS WITH CONTRAST TECHNIQUE: Multidetector CT imaging of the abdomen and pelvis was performed using the standard protocol following bolus administration of intravenous contrast. RADIATION DOSE REDUCTION: This exam was performed  according to the departmental dose-optimization program which includes automated exposure control, adjustment of the mA and/or kV according to patient size and/or use of  iterative reconstruction technique. CONTRAST:  OMNIPAQUE IOHEXOL 300 MG/ML  SOLN COMPARISON:  Abdominopelvic CT 5 hours earlier the same date. No other relevant comparison studies. FINDINGS: Lower chest: Clear lung bases. No significant pleural or pericardial effusion. Hepatobiliary: The liver is normal in density without suspicious focal abnormality. The liver is imaged prior to opacification of the hepatic veins. New intermediate density within the gallbladder lumen likely due to vicarious excretion of previously administered intravenous contrast. No evidence of gallstones, gallbladder wall thickening or biliary dilatation. Pancreas: Unremarkable. No pancreatic ductal dilatation or surrounding inflammatory changes. Spleen: Normal in size without focal abnormality. Adrenals/Urinary Tract: Both adrenal glands appear normal. No significant abnormality of the kidneys identified. There is contrast material within the renal collecting systems, ureter and bladder from the earlier CT. No bladder abnormalities are seen. Stomach/Bowel: A small amount of enteric contrast was administered and has passed into the distal small bowel. Although the enteric contrast mildly improves bowel assessment, evaluation is still limited by incomplete bowel opacification and limited visceral fat. The stomach and proximal small bowel appear unremarkable. There is no evidence of bowel obstruction. The terminal ileum is not yet opacified with contrast. The appendix is not clearly visualized. No colonic abnormalities are identified. Vascular/Lymphatic: There are no enlarged abdominal or pelvic lymph nodes. No significant vascular findings. Reproductive: The prostate gland and seminal vesicles appear unremarkable. Other: There is a small amount of free pelvic fluid. No  extravasated enteric contrast or pneumoperitoneum. No definite extraluminal fluid collections are seen. Musculoskeletal: No acute or significant osseous findings. IMPRESSION: 1. Small amount of free pelvic fluid, nonspecific. 2. No extravasated enteric contrast or pneumoperitoneum. No evidence of bowel obstruction or perforation. 3. The appendix is still not clearly visualized due to incomplete distal small bowel opacification and limited visceral fat. Surgical consultation recommended if there is clinical concern for acute appendicitis in this patient with mild leukocytosis. Electronically Signed   By: Carey Bullocks M.D.   On: 04/27/2023 12:53   CT ABDOMEN PELVIS W CONTRAST Result Date: 04/27/2023 CLINICAL DATA:  Abdominal pain, acute. Cramping which began after eating spaghetti this evening. EXAM: CT ABDOMEN AND PELVIS WITH CONTRAST TECHNIQUE: Multidetector CT imaging of the abdomen and pelvis was performed using the standard protocol following bolus administration of intravenous contrast. RADIATION DOSE REDUCTION: This exam was performed according to the departmental dose-optimization program which includes automated exposure control, adjustment of the mA and/or kV according to patient size and/or use of iterative reconstruction technique. CONTRAST:  OMNIPAQUE IOHEXOL 300 MG/ML  SOLN COMPARISON:  None Available. FINDINGS: Lower chest: No acute abnormality. Hepatobiliary: No focal liver abnormality is seen. No gallstones, gallbladder wall thickening, or biliary dilatation. Pancreas: Unremarkable. No pancreatic ductal dilatation or surrounding inflammatory changes. Spleen: Normal in size without focal abnormality. Adrenals/Urinary Tract: Adrenal glands are unremarkable. Kidneys are normal, without renal calculi, focal lesion, or hydronephrosis. Bladder is unremarkable. Stomach/Bowel: Evaluation of bowel pathology is limited due to lack of enteric contrast material and diminished visceral fat within the  abdomen and pelvis. The stomach appears normal. The proximal and mid small bowel loops are nondilated. Within the lower abdomen there are several loops of dilated small bowel which measure up to 3.1 cm with decompressed bowel seen proximal and distal to the dilated bowel loops. Abrupt transition to decreased caliber small bowel is noted at the proximal and distal end of the contiguous dilated small bowel loops, image 45 of series 7 and image 42 of series 7. The appendix is not confidently  identified, which reflects limiting factors described above. Normal caliber of the colon up to the level of the rectum. No bowel wall thickening identified. Vascular/Lymphatic: Normal appearance of the abdominal aorta. The upper abdominal vascularity appears patent. No sign abdominopelvic adenopathy. Reproductive: Prostate is unremarkable. Other: No significant free fluid. Within the posterior pelvis there is a fluid density structure anterior to the rectum measuring 4.2 x 2.1 cm, image 69/7 which although likely represents a loop of small bowel this would be difficult to distinguish from underlying fluid collection due to lack of enteric contrast material. No signs of pneumoperitoneum. Musculoskeletal: No acute or significant osseous findings. IMPRESSION: 1. Evaluation of bowel pathology is limited due to lack of enteric contrast material and diminished visceral fat within the abdomen and pelvis. 2. Within the lower abdomen there are several loops of fecalized, dilated small bowel which measure up to 3.1 cm with decompressed bowel seen proximal and distal to the dilated bowel loops. Abrupt transition to decreased caliber small bowel is noted at the proximal and distal end of the contiguous dilated small bowel loops. Differential considerations include close loop obstruction versus focal ileus. Repeat imaging with IV and enteric contrast material may be helpful for further delineation. 3. Nonvisualization of the appendix. If there  is a clinical concern for acute appendicitis then repeat imaging following enteric contrast material would be recommended. 4. Within the posterior pelvis there is a fluid density structure anterior to the rectum measuring 4.2 x 2.1 cm which although likely represents a loop of small bowel this would be difficult to distinguish from underlying abscess due to lack of enteric contrast material. Electronically Signed   By: Signa Kell M.D.   On: 04/27/2023 06:48    Pending Labs Unresulted Labs (From admission, onward)     Start     Ordered   05/04/23 0500  Creatinine, serum  (enoxaparin (LOVENOX)    CrCl >/= 30 ml/min)  Weekly,   R     Comments: while on enoxaparin therapy    04/27/23 1642            Vitals/Pain Today's Vitals   04/28/23 0145 04/28/23 0354 04/28/23 0439 04/28/23 0745  BP: 97/63  98/67 103/82  Pulse: 61  60 69  Resp: 18  17 18   Temp: 98.1 F (36.7 C)  98.7 F (37.1 C) 97.9 F (36.6 C)  TempSrc:    Oral  SpO2: 100%  98% 96%  Weight:      Height:      PainSc:  Asleep      Isolation Precautions No active isolations  Medications Medications  dextrose 5 % and 0.45 % NaCl infusion (0 mLs Intravenous Stopped 04/28/23 0500)  acetaminophen (TYLENOL) tablet 650 mg (has no administration in time range)  diphenhydrAMINE (BENADRYL) capsule 25 mg (has no administration in time range)    Or  diphenhydrAMINE (BENADRYL) injection 25 mg (has no administration in time range)  morphine (PF) 2 MG/ML injection 2-4 mg (has no administration in time range)  ondansetron (ZOFRAN-ODT) disintegrating tablet 4 mg (has no administration in time range)    Or  ondansetron (ZOFRAN) injection 4 mg (has no administration in time range)  prochlorperazine (COMPAZINE) tablet 10 mg (has no administration in time range)    Or  prochlorperazine (COMPAZINE) injection 5-10 mg (has no administration in time range)  hydrALAZINE (APRESOLINE) injection 10 mg (has no administration in time range)   enoxaparin (LOVENOX) injection 40 mg (has no administration in time range)  ondansetron (  ZOFRAN) injection 4 mg (4 mg Intravenous Given 04/27/23 0434)  dicyclomine (BENTYL) injection 20 mg (20 mg Intramuscular Given 04/27/23 0434)  iohexol (OMNIPAQUE) 300 MG/ML solution 100 mL (100 mLs Intravenous Contrast Given 04/27/23 0539)  iohexol (OMNIPAQUE) 300 MG/ML solution 30 mL (30 mLs Oral Contrast Given 04/27/23 0718)  ondansetron (ZOFRAN) injection 4 mg (4 mg Intravenous Given 04/27/23 0901)  morphine (PF) 2 MG/ML injection 2 mg (2 mg Intravenous Given by Other 04/27/23 0902)  iohexol (OMNIPAQUE) 300 MG/ML solution 100 mL (100 mLs Intravenous Contrast Given 04/27/23 1059)  metoCLOPramide (REGLAN) injection 10 mg (10 mg Intravenous Given 04/27/23 1047)  piperacillin-tazobactam (ZOSYN) IVPB 3.375 g (0 g Intravenous Stopped 04/27/23 1751)    Mobility walks     Focused Assessments  R Recommendations: See Admitting Provider Note  Report given to:   Additional Notes:

## 2023-04-28 NOTE — Discharge Summary (Addendum)
[facility] Discharge Summary   Patient ID: Kevin Costa MRN: [id number] DOB/AGE: 41-Sep-1982 41 y.o.  Admit date: 04/27/2023 Discharge date: 04/28/2023  Admitting Diagnosis: Abdominal pain Vomiting Leukocytosis   Discharge Diagnosis Patient Active Problem List   Diagnosis Date Noted   Abdominal pain 04/27/2023    Consultants None   Imaging: CT ABDOMEN PELVIS W CONTRAST Result Date: 04/27/2023 CLINICAL DATA:  Abdominal pain, acute, nonlocalized. Multiple episodes of vomiting. EXAM: CT ABDOMEN AND PELVIS WITH CONTRAST TECHNIQUE: Multidetector CT imaging of the abdomen and pelvis was performed using the standard protocol following bolus administration of intravenous contrast. RADIATION DOSE REDUCTION: This exam was performed according to the departmental dose-optimization program which includes automated exposure control, adjustment of the mA and/or kV according to patient size and/or use of iterative reconstruction technique. CONTRAST:  OMNIPAQUE IOHEXOL 300 MG/ML  SOLN COMPARISON:  Abdominopelvic CT 5 hours earlier the same date. No other relevant comparison studies. FINDINGS: Lower chest: Clear lung bases. No significant pleural or pericardial effusion. Hepatobiliary: The liver is normal in density without suspicious focal abnormality. The liver is imaged prior to opacification of the hepatic veins. New intermediate density within the gallbladder lumen likely due to vicarious excretion of previously administered intravenous contrast. No evidence of gallstones, gallbladder wall thickening or biliary dilatation. Pancreas: Unremarkable. No pancreatic ductal dilatation or surrounding inflammatory changes. Spleen: Normal in size without focal abnormality. Adrenals/Urinary Tract: Both adrenal glands appear normal. No significant abnormality of the kidneys identified. There is contrast material within the renal collecting systems, ureter and bladder from the earlier CT. No  bladder abnormalities are seen. Stomach/Bowel: A small amount of enteric contrast was administered and has passed into the distal small bowel. Although the enteric contrast mildly improves bowel assessment, evaluation is still limited by incomplete bowel opacification and limited visceral fat. The stomach and proximal small bowel appear unremarkable. There is no evidence of bowel obstruction. The terminal ileum is not yet opacified with contrast. The appendix is not clearly visualized. No colonic abnormalities are identified. Vascular/Lymphatic: There are no enlarged abdominal or pelvic lymph nodes. No significant vascular findings. Reproductive: The prostate gland and seminal vesicles appear unremarkable. Other: There is a small amount of free pelvic fluid. No extravasated enteric contrast or pneumoperitoneum. No definite extraluminal fluid collections are seen. Musculoskeletal: No acute or significant osseous findings. IMPRESSION: 1. Small amount of free pelvic fluid, nonspecific. 2. No extravasated enteric contrast or pneumoperitoneum. No evidence of bowel obstruction or perforation. 3. The appendix is still not clearly visualized due to incomplete distal small bowel opacification and limited visceral fat. Surgical consultation recommended if there is clinical concern for acute appendicitis in this patient with mild leukocytosis. Electronically Signed   By: Carey Bullocks M.D.   On: 04/27/2023 12:53   CT ABDOMEN PELVIS W CONTRAST Result Date: 04/27/2023 CLINICAL DATA:  Abdominal pain, acute. Cramping which began after eating spaghetti this evening. EXAM: CT ABDOMEN AND PELVIS WITH CONTRAST TECHNIQUE: Multidetector CT imaging of the abdomen and pelvis was performed using the standard protocol following bolus administration of intravenous contrast. RADIATION DOSE REDUCTION: This exam was performed according to the departmental dose-optimization program which includes automated exposure control, adjustment of  the mA and/or kV according to patient size and/or use of iterative reconstruction technique. CONTRAST:  OMNIPAQUE IOHEXOL 300 MG/ML  SOLN COMPARISON:  None Available. FINDINGS: Lower chest: No acute abnormality. Hepatobiliary: No focal liver abnormality is seen. No gallstones, gallbladder wall thickening, or biliary dilatation. Pancreas: Unremarkable. No pancreatic  ductal dilatation or surrounding inflammatory changes. Spleen: Normal in size without focal abnormality. Adrenals/Urinary Tract: Adrenal glands are unremarkable. Kidneys are normal, without renal calculi, focal lesion, or hydronephrosis. Bladder is unremarkable. Stomach/Bowel: Evaluation of bowel pathology is limited due to lack of enteric contrast material and diminished visceral fat within the abdomen and pelvis. The stomach appears normal. The proximal and mid small bowel loops are nondilated. Within the lower abdomen there are several loops of dilated small bowel which measure up to 3.1 cm with decompressed bowel seen proximal and distal to the dilated bowel loops. Abrupt transition to decreased caliber small bowel is noted at the proximal and distal end of the contiguous dilated small bowel loops, image 45 of series 7 and image 42 of series 7. The appendix is not confidently identified, which reflects limiting factors described above. Normal caliber of the colon up to the level of the rectum. No bowel wall thickening identified. Vascular/Lymphatic: Normal appearance of the abdominal aorta. The upper abdominal vascularity appears patent. No sign abdominopelvic adenopathy. Reproductive: Prostate is unremarkable. Other: No significant free fluid. Within the posterior pelvis there is a fluid density structure anterior to the rectum measuring 4.2 x 2.1 cm, image 69/7 which although likely represents a loop of small bowel this would be difficult to distinguish from underlying fluid collection due to lack of enteric contrast material. No signs of  pneumoperitoneum. Musculoskeletal: No acute or significant osseous findings. IMPRESSION: 1. Evaluation of bowel pathology is limited due to lack of enteric contrast material and diminished visceral fat within the abdomen and pelvis. 2. Within the lower abdomen there are several loops of fecalized, dilated small bowel which measure up to 3.1 cm with decompressed bowel seen proximal and distal to the dilated bowel loops. Abrupt transition to decreased caliber small bowel is noted at the proximal and distal end of the contiguous dilated small bowel loops. Differential considerations include close loop obstruction versus focal ileus. Repeat imaging with IV and enteric contrast material may be helpful for further delineation. 3. Nonvisualization of the appendix. If there is a clinical concern for acute appendicitis then repeat imaging following enteric contrast material would be recommended. 4. Within the posterior pelvis there is a fluid density structure anterior to the rectum measuring 4.2 x 2.1 cm which although likely represents a loop of small bowel this would be difficult to distinguish from underlying abscess due to lack of enteric contrast material. Electronically Signed   By: Signa Kell M.D.   On: 04/27/2023 06:48    Procedures None  Hospital Course:  42 y/o M who presented to the ED with acute onset abdominal pain, nausea, and vomiting.  Workup significant  for a leukocytosis of 14,600 and the appendix was not visualized on CT scan. Patient reported sick contacts at work who have had GI symptoms. He also had RLQ abdominal tenderness on exam. He was admitted for observation to rule out appendicitis. After 24 hours of IVF and supportive care the patients abdominal pain and  vomiting resolved and he was diagnosed with likely gastroenteritis. WBC decreased to 11. He was discharged home on 04/28/23 with instructions to stay hydrated, eat a bland/BRAT diet, and with return precautions.  Physical  Exam: General:  Alert, NAD, pleasant, comfortable Abd:  Soft, ND, nontender, no hernias or masses  Allergies as of 04/28/2023   No Known Allergies      Medication List     TAKE these medications    acetaminophen 325 MG tablet Commonly known as: TYLENOL  Take 2 tablets (650 mg total) by mouth every 6 (six) hours as needed for mild pain (pain score 1-3) or fever.   cyclobenzaprine 10 MG tablet Commonly known as: FLEXERIL Take 1 tablet (10 mg total) by mouth 2 (two) times daily as needed for muscle spasms.   meloxicam 15 MG tablet Commonly known as: Mobic Take 1 tablet (15 mg total) by mouth daily.   penicillin v potassium 500 MG tablet Commonly known as: VEETID Take 1 tablet (500 mg total) by mouth 3 (three) times daily.   predniSONE 10 MG (21) Tbpk tablet Commonly known as: STERAPRED UNI-PAK 21 TAB Take by mouth daily. Take 6 tabs by mouth daily  for 2 days, then 5 tabs for 2 days, then 4 tabs for 2 days, then 3 tabs for 2 days, 2 tabs for 2 days, then 1 tab by mouth daily for 2 days   traMADol 50 MG tablet Commonly known as: ULTRAM Take 1 tablet (50 mg total) by mouth every 6 (six) hours as needed.   triamcinolone 0.025 % ointment Commonly known as: KENALOG Apply 1 application topically 2 (two) times daily.           Signed: Hosie Spangle, [facility] 04/28/2023, 8:21 AM

## 2023-12-14 ENCOUNTER — Encounter (HOSPITAL_BASED_OUTPATIENT_CLINIC_OR_DEPARTMENT_OTHER): Payer: Self-pay | Admitting: Emergency Medicine

## 2023-12-14 ENCOUNTER — Other Ambulatory Visit: Payer: Self-pay

## 2023-12-14 ENCOUNTER — Emergency Department (HOSPITAL_BASED_OUTPATIENT_CLINIC_OR_DEPARTMENT_OTHER)
Admission: EM | Admit: 2023-12-14 | Discharge: 2023-12-14 | Disposition: A | Discharge status: Home / Self Care | Payer: Self-pay | Attending: Emergency Medicine | Admitting: Emergency Medicine

## 2023-12-14 ENCOUNTER — Other Ambulatory Visit (HOSPITAL_BASED_OUTPATIENT_CLINIC_OR_DEPARTMENT_OTHER): Payer: Self-pay

## 2023-12-14 DIAGNOSIS — K029 Dental caries, unspecified: Secondary | ICD-10-CM | POA: Insufficient documentation

## 2023-12-14 MED ORDER — KETOROLAC TROMETHAMINE 15 MG/ML IJ SOLN
15.0000 mg | Freq: Once | INTRAMUSCULAR | Status: AC
Start: 2023-12-14 — End: 2023-12-14
  Administered 2023-12-14: 15 mg via INTRAMUSCULAR
  Filled 2023-12-14: qty 1

## 2023-12-14 MED ORDER — HYDROCODONE-ACETAMINOPHEN 5-325 MG PO TABS
1.0000 | ORAL_TABLET | Freq: Once | ORAL | Status: AC
  Administered 2023-12-14: 1 via ORAL
  Filled 2023-12-14: qty 1

## 2023-12-14 MED ORDER — LIDOCAINE VISCOUS HCL 2 % MT SOLN
15.0000 mL | Freq: Once | OROMUCOSAL | Status: AC
  Administered 2023-12-14: 15 mL via OROMUCOSAL
  Filled 2023-12-14: qty 15

## 2023-12-14 MED ORDER — AMOXICILLIN-POT CLAVULANATE 875-125 MG PO TABS
1.0000 | ORAL_TABLET | Freq: Two times a day (BID) | ORAL | 0 refills | Status: AC
  Filled 2023-12-14: qty 14, 7d supply, fill #0

## 2023-12-14 MED ORDER — OXYCODONE HCL 5 MG PO TABS
5.0000 mg | ORAL_TABLET | Freq: Four times a day (QID) | ORAL | 0 refills | Status: AC | PRN
  Filled 2023-12-14: qty 10, 3d supply, fill #0

## 2023-12-14 NOTE — ED Provider Notes (Signed)
 [place] [facility] Provider Note   CSN: [id number] Arrival date & time: 12/14/23  1348     Patient presents with: Dental Pain   Kevin Costa is a 43 y.o. male patient reports left lower molar pain for months.  Reports that it started getting worse yesterday and even more significant today.  He has not noticed any drainage or significant swelling.  Reports it is painful when touching the area.  He has no trouble swallowing.  No anterior neck pain or swelling.  Handling secretions and has normal phonation.    Dental Pain      Prior to Admission medications   Medication Sig Start Date End Date Taking? Authorizing Provider  amoxicillin -clavulanate (AUGMENTIN ) 875-125 MG tablet Take 1 tablet by mouth every 12 (twelve) hours. 12/14/23  Yes Hammond Obeirne, Warren SAILOR, PA-C  oxyCODONE  (ROXICODONE ) 5 MG immediate release tablet Take 1 tablet (5 mg total) by mouth every 6 (six) hours as needed for severe pain (pain score 7-10) or breakthrough pain. 12/14/23  Yes Cornella Emmer, Warren SAILOR, PA-C  acetaminophen  (TYLENOL ) 325 MG tablet Take 2 tablets (650 mg total) by mouth every 6 (six) hours as needed for mild pain (pain score 1-3) or fever. 04/28/23   Augustus Almarie RAMAN, PA-C  cyclobenzaprine  (FLEXERIL ) 10 MG tablet Take 1 tablet (10 mg total) by mouth 2 (two) times daily as needed for muscle spasms. Patient not taking: Reported on 04/27/2023 10/19/19   Fawze, Mina A, PA-C  meloxicam  (MOBIC ) 15 MG tablet Take 1 tablet (15 mg total) by mouth daily. Patient not taking: Reported on 04/27/2023 09/28/22   Palumbo, April, MD  penicillin  v potassium (VEETID) 500 MG tablet Take 1 tablet (500 mg total) by mouth 3 (three) times daily. Patient not taking: Reported on 04/27/2023 12/15/15   Terryl Kubas, NP  predniSONE  (STERAPRED UNI-PAK 21 TAB) 10 MG (21) TBPK tablet Take by mouth daily. Take 6 tabs by mouth daily  for 2 days, then 5 tabs for 2 days, then 4 tabs for 2 days, then 3 tabs for 2  days, 2 tabs for 2 days, then 1 tab by mouth daily for 2 days Patient not taking: Reported on 04/27/2023 10/19/19   Meryle Ip A, PA-C  traMADol  (ULTRAM ) 50 MG tablet Take 1 tablet (50 mg total) by mouth every 6 (six) hours as needed. Patient not taking: Reported on 04/27/2023 12/15/15   Terryl Kubas, NP  triamcinolone  (KENALOG ) 0.025 % ointment Apply 1 application topically 2 (two) times daily. Patient not taking: Reported on 04/27/2023 06/24/17   Silva Speaker A, PA-C    Allergies: Patient has no known allergies.    Review of Systems  Gastrointestinal:  Positive for abdominal pain.    Updated Vital Signs BP (!) 163/91 (BP Location: Left Arm)   Pulse (!) 54   Temp 98.2 F (36.8 C) (Oral)   Resp 20   Ht 6' 7 (2.007 m)   Wt 87.1 kg   SpO2 100%   BMI 21.63 kg/m   Physical Exam Vitals and nursing note reviewed.  Constitutional:      General: He is not in acute distress.    Appearance: He is not toxic-appearing.  HENT:     Head: Normocephalic and atraumatic.     Mouth/Throat:     Dentition: Gingival swelling and dental caries present. No dental abscesses.   Eyes:     General: No scleral icterus.    Conjunctiva/sclera: Conjunctivae normal.  Cardiovascular:  Rate and Rhythm: Normal rate and regular rhythm.     Pulses: Normal pulses.     Heart sounds: Normal heart sounds.  Pulmonary:     Effort: Pulmonary effort is normal. No respiratory distress.     Breath sounds: Normal breath sounds.  Abdominal:     General: Abdomen is flat. Bowel sounds are normal. There is no distension.     Palpations: Abdomen is soft. There is no mass.     Tenderness: There is no abdominal tenderness.  Musculoskeletal:     Right lower leg: No edema.     Left lower leg: No edema.  Skin:    General: Skin is warm and dry.     Findings: No lesion.  Neurological:     General: No focal deficit present.     Mental Status: He is alert and oriented to person, place, and time. Mental status is at  baseline.     (all labs ordered are listed, but only abnormal results are displayed) Labs Reviewed - No data to display  EKG: None  Radiology: No results found.   Procedures   Medications Ordered in the ED  lidocaine  (XYLOCAINE ) 2 % viscous mouth solution 15 mL (15 mLs Mouth/Throat Given 12/14/23 1521)  ketorolac  (TORADOL ) 15 MG/ML injection 15 mg (15 mg Intramuscular Given 12/14/23 1522)  HYDROcodone -acetaminophen  (NORCO/VICODIN) 5-325 MG per tablet 1 tablet (1 tablet Oral Given 12/14/23 1521)                                    Medical Decision Making Risk Prescription drug management.   This patient presents to the ED for concern of dental pain, this involves an extensive number of treatment options, and is a complaint that carries with it a high risk of complications and morbidity.  The differential diagnosis includes Ludwig's angina, peritonsillar abscess, retropharyngeal abscess, dental abscess, dental pain  Problem List / ED Course / Critical interventions / Medication management  Patient presenting with complaint of dental pain.  He has multiple dental caries.  On exam he has cracked left lower molar which is likely contributing to pain.  He has some gingival erythema but no significant abscess on exam.  His uvula is midline.  Handling secretions.  Exam is not consistent with Ludwig's angina.  Pain improved here.  Will send home with pain medicine and antibiotic. I ordered medication including viscous lidocaine , Toradol , Norco.  Offered dental block, patient declined. I have reviewed the patients home medicines and have made adjustments as needed Patient has dentist appointment tomorrow.  Feel stable for discharge with outpatient follow-up with them.       Final diagnoses:  Pain due to dental caries    ED Discharge Orders          Ordered    oxyCODONE  (ROXICODONE ) 5 MG immediate release tablet  Every 6 hours PRN        12/14/23 1512    amoxicillin -clavulanate  (AUGMENTIN ) 875-125 MG tablet  Every 12 hours        12/14/23 1512               Shenica Holzheimer, Warren SAILOR, PA-C 12/14/23 1547    Ruthe Cornet, DO 12/14/23 1558

## 2023-12-14 NOTE — ED Triage Notes (Signed)
 Pt reports excruciating pain in the RT back molar, called dentist but cannot be seen until tomorrow, has tried OTC meds with no relief

## 2023-12-14 NOTE — Discharge Instructions (Addendum)
 Follow-up with dentist tomorrow as discussed.  1000 mg of Tylenol  every 8 hours.  You can take ibuprofen every 8 hours.  Take oxycodone  for breakthrough pain.  Return to emergency room with new or worsening symptoms.

## 2024-01-04 ENCOUNTER — Other Ambulatory Visit (HOSPITAL_COMMUNITY): Payer: Self-pay
